# Patient Record
Sex: Female | Born: 1955 | Hispanic: Yes | Marital: Married
Health system: Southern US, Community
[De-identification: ages and names within clinical notes are randomized; demographics above are authoritative.]

## PROBLEM LIST (undated history)

## (undated) DIAGNOSIS — R42 Dizziness and giddiness: Secondary | ICD-10-CM

---

## 2017-05-09 ENCOUNTER — Emergency Department (HOSPITAL_COMMUNITY): Payer: Self-pay

## 2017-05-09 ENCOUNTER — Emergency Department (HOSPITAL_COMMUNITY)
Admission: EM | Admit: 2017-05-09 | Discharge: 2017-05-09 | Disposition: A | Discharge status: Home / Self Care | Payer: Self-pay | Attending: Emergency Medicine | Admitting: Emergency Medicine

## 2017-05-09 ENCOUNTER — Encounter (HOSPITAL_COMMUNITY): Payer: Self-pay | Admitting: Emergency Medicine

## 2017-05-09 DIAGNOSIS — W0110XA Fall on same level from slipping, tripping and stumbling with subsequent striking against unspecified object, initial encounter: Secondary | ICD-10-CM | POA: Insufficient documentation

## 2017-05-09 DIAGNOSIS — Y9389 Activity, other specified: Secondary | ICD-10-CM | POA: Insufficient documentation

## 2017-05-09 DIAGNOSIS — S065XAA Traumatic subdural hemorrhage with loss of consciousness status unknown, initial encounter: Secondary | ICD-10-CM

## 2017-05-09 DIAGNOSIS — Y929 Unspecified place or not applicable: Secondary | ICD-10-CM | POA: Insufficient documentation

## 2017-05-09 DIAGNOSIS — Y998 Other external cause status: Secondary | ICD-10-CM | POA: Insufficient documentation

## 2017-05-09 DIAGNOSIS — S065X9A Traumatic subdural hemorrhage with loss of consciousness of unspecified duration, initial encounter: Secondary | ICD-10-CM

## 2017-05-09 DIAGNOSIS — S065X0A Traumatic subdural hemorrhage without loss of consciousness, initial encounter: Secondary | ICD-10-CM | POA: Insufficient documentation

## 2017-05-09 HISTORY — DX: Dizziness and giddiness: R42

## 2017-05-09 LAB — CBC WITH DIFFERENTIAL/PLATELET
BASOS PCT: 0 %
Basophils Absolute: 0 10*3/uL (ref 0.0–0.1)
EOS PCT: 2 %
Eosinophils Absolute: 0.1 10*3/uL (ref 0.0–0.7)
HEMATOCRIT: 36.9 % (ref 36.0–46.0)
Hemoglobin: 12.1 g/dL (ref 12.0–15.0)
LYMPHS PCT: 25 %
Lymphs Abs: 1.5 10*3/uL (ref 0.7–4.0)
MCH: 30.6 pg (ref 26.0–34.0)
MCHC: 32.8 g/dL (ref 30.0–36.0)
MCV: 93.2 fL (ref 78.0–100.0)
MONO ABS: 0.4 10*3/uL (ref 0.1–1.0)
MONOS PCT: 7 %
NEUTROS ABS: 3.8 10*3/uL (ref 1.7–7.7)
Neutrophils Relative %: 66 %
PLATELETS: 214 10*3/uL (ref 150–400)
RBC: 3.96 MIL/uL (ref 3.87–5.11)
RDW: 12.6 % (ref 11.5–15.5)
WBC: 5.7 10*3/uL (ref 4.0–10.5)

## 2017-05-09 LAB — COMPREHENSIVE METABOLIC PANEL
ALBUMIN: 3.6 g/dL (ref 3.5–5.0)
ALK PHOS: 75 U/L (ref 38–126)
ALT: 18 U/L (ref 14–54)
ANION GAP: 9 (ref 5–15)
AST: 21 U/L (ref 15–41)
BILIRUBIN TOTAL: 0.4 mg/dL (ref 0.3–1.2)
BUN: 9 mg/dL (ref 6–20)
CALCIUM: 8.8 mg/dL — AB (ref 8.9–10.3)
CO2: 24 mmol/L (ref 22–32)
Chloride: 110 mmol/L (ref 101–111)
Creatinine, Ser: 0.39 mg/dL — ABNORMAL LOW (ref 0.44–1.00)
GFR calc Af Amer: >60 mL/min (ref >60)
GFR calc non Af Amer: >60 mL/min (ref >60)
GLUCOSE: 117 mg/dL — AB (ref 65–99)
Potassium: 4 mmol/L (ref 3.5–5.1)
SODIUM: 143 mmol/L (ref 135–145)
TOTAL PROTEIN: 6.5 g/dL (ref 6.5–8.1)

## 2017-05-09 LAB — PROTIME-INR
INR: 1.12
Prothrombin Time: 14.3 seconds (ref 11.4–15.2)

## 2017-05-09 LAB — TYPE AND SCREEN
ABO/RH(D): A POS
Antibody Screen: NEGATIVE

## 2017-05-09 LAB — ABO/RH: ABO/RH(D): A POS

## 2017-05-09 MED ORDER — METHYLPREDNISOLONE 4 MG PO TBPK: ORAL_TABLET | ORAL | 0 refills | Status: AC

## 2017-05-09 MED ORDER — LEVETIRACETAM 500 MG PO TABS: 500.0000 mg | ORAL_TABLET | Freq: Two times a day (BID) | ORAL | 0 refills | Status: AC

## 2017-05-09 MED ORDER — LEVETIRACETAM 500 MG PO TABS
500.0000 mg | ORAL_TABLET | Freq: Once | ORAL | Status: AC
  Administered 2017-05-09: 500 mg via ORAL
  Filled 2017-05-09: qty 1

## 2017-05-09 MED ORDER — SODIUM CHLORIDE 0.9 % IV BOLUS
1000.0000 mL | Freq: Once | INTRAVENOUS | Status: AC
  Administered 2017-05-09: 1000 mL via INTRAVENOUS

## 2017-05-09 MED ORDER — SODIUM CHLORIDE 0.9 % IV BOLUS: 1000.0000 mL | Freq: Once | INTRAVENOUS | Status: DC

## 2017-05-09 NOTE — Consult Note (Signed)
Chief Complaint   Chief Complaint  Patient presents with  . Headache    HPI   HPI: Sabrina Briggs is a 62 y.o. female who presented to ER due to headache. Patient is non-english speaking. History obtained using assistance of interpreter. Fell 15 days ago, striking head. It was an unwitnessed fall. Unsure about LOC. Complains of diffuse HA since the fall. Moderate in severity. Has been taking Tylenol and an unknown medication from [place] without relief. Yesterday she felt dizzy and unsteady on feet. Today, no dizziness or gait instability. No focal deficits. Endorses photophobia but denies blurry vision, double vision. Denies nausea, vomiting. Not on anti-coag.  There are no active problems to display for this patient.  PMH: Past Medical History:  Diagnosis Date  . Vertigo     PSH:  (Not in a hospital admission)  SH: Social History   Tobacco Use  . Smoking status: Not on file  Substance Use Topics  . Alcohol use: Not on file  . Drug use: Not on file    MEDS: Prior to Admission medications   Not on File    ALLERGY: No Known Allergies  Social History   Tobacco Use  . Smoking status: Not on file  Substance Use Topics  . Alcohol use: Not on file     No family history on file.   ROS   Review of Systems  Constitutional: Positive for malaise/fatigue. Negative for chills and fever.  HENT: Negative.   Respiratory: Negative.   Cardiovascular: Negative.   Gastrointestinal: Negative.   Genitourinary: Negative.   Musculoskeletal: Positive for falls and neck pain (chronic, states undiagnosed "disease in throat"). Negative for back pain.  Neurological: Positive for dizziness (resolved), weakness (generalized, no focal deficits) and headaches. Negative for tingling, tremors, sensory change, speech change, focal weakness and seizures.  Endo/Heme/Allergies: Negative.     Exam   Vitals:   05/09/17 0927 05/09/17 1109  BP: (!) 143/65 136/76  Pulse: 81 73  Resp: 14  18  Temp: 97.9 F (36.6 C)   SpO2: 100% 99%   General appearance: Elderly female, NAD Eyes: PERRL, Fundoscopic: normal Cardiovascular: Regular rate and rhythm without murmurs, rubs, gallops. No edema or variciosities. Distal pulses normal. Pulmonary: Clear to auscultation Musculoskeletal:     Muscle tone upper extremities: Normal    Muscle tone lower extremities: Normal    Motor exam: Upper Extremities Deltoid Bicep Tricep Grip  Right 5/5 5/5 5/5 5/5  Left 5/5 5/5 5/5 5/5   Lower Extremity IP Quad PF DF EHL  Right 5/5 5/5 5/5 5/5 5/5  Left 5/5 5/5 5/5 5/5 5/5   Neurological Awake, alert, oriented Memory and concentration grossly intact Speech fluent, appropriate CNII: Visual fields normal CNIII/IV/VI: EOMI CNV: Facial sensation normal CNVII: Symmetric, normal strength CNVIII: Grossly normal CNIX: Normal palate movement CNXI: Trap and SCM strength normal CN XII: Tongue protrusion normal Sensation grossly intact to LT DTR: Normal Coordination (finger/nose & heel/shin): Normal  Results - Imaging/Labs   Results for orders placed or performed during the hospital encounter of 05/09/17 (from the past 48 hour(s))  Comprehensive metabolic panel     Status: Abnormal   Collection Time: 05/09/17 10:28 AM  Result Value Ref Range   Sodium 143 135 - 145 mmol/L   Potassium 4.0 3.5 - 5.1 mmol/L   Chloride 110 101 - 111 mmol/L   CO2 24 22 - 32 mmol/L   Glucose, Bld 117 (H) 65 - 99 mg/dL   BUN 9 6 -  20 mg/dL   Creatinine, Ser 0.39 (L) 0.44 - 1.00 mg/dL   Calcium 8.8 (L) 8.9 - 10.3 mg/dL   Total Protein 6.5 6.5 - 8.1 g/dL   Albumin 3.6 3.5 - 5.0 g/dL   AST 21 15 - 41 U/L   ALT 18 14 - 54 U/L   Alkaline Phosphatase 75 38 - 126 U/L   Total Bilirubin 0.4 0.3 - 1.2 mg/dL   GFR calc non Af Amer >60 >60 mL/min   GFR calc Af Amer >60 >60 mL/min    Comment: (NOTE) The eGFR has been calculated using the CKD EPI equation. This calculation has not been validated in all clinical  situations. eGFR's persistently <60 mL/min signify possible Chronic Kidney Disease.    Anion gap 9 5 - 15    Comment: Performed at [facility] [address] [place], [place] [address]  CBC with Differential     Status: None   Collection Time: 05/09/17 10:28 AM  Result Value Ref Range   WBC 5.7 4.0 - 10.5 K/uL   RBC 3.96 3.87 - 5.11 MIL/uL   Hemoglobin 12.1 12.0 - 15.0 g/dL   HCT 36.9 36.0 - 46.0 %   MCV 93.2 78.0 - 100.0 fL   MCH 30.6 26.0 - 34.0 pg   MCHC 32.8 30.0 - 36.0 g/dL   RDW 12.6 11.5 - 15.5 %   Platelets 214 150 - 400 K/uL   Neutrophils Relative % 66 %   Neutro Abs 3.8 1.7 - 7.7 K/uL   Lymphocytes Relative 25 %   Lymphs Abs 1.5 0.7 - 4.0 K/uL   Monocytes Relative 7 %   Monocytes Absolute 0.4 0.1 - 1.0 K/uL   Eosinophils Relative 2 %   Eosinophils Absolute 0.1 0.0 - 0.7 K/uL   Basophils Relative 0 %   Basophils Absolute 0.0 0.0 - 0.1 K/uL    Comment: Performed at [facility] [address], [place], [place] [address]  Protime-INR     Status: None   Collection Time: 05/09/17 11:13 AM  Result Value Ref Range   Prothrombin Time 14.3 11.4 - 15.2 seconds   INR 1.12     Comment: Performed at [facility] [address] [place], [place] [address]  Type and screen     Status: None (Preliminary result)   Collection Time: 05/09/17 11:27 AM  Result Value Ref Range   ABO/RH(D) A POS    Antibody Screen PENDING    Sample Expiration      05/12/2017 Performed at [facility], [place] [address] [place], [place] [address]     Ct Head Wo Contrast  Result Date: 05/09/2017 CLINICAL DATA:  Status post fall 15 days ago. Generalized headache for 1 week. EXAM: CT HEAD WITHOUT CONTRAST TECHNIQUE: Contiguous axial images were obtained from the base of the skull through the vertex without intravenous contrast. COMPARISON:  None. FINDINGS: Brain: The patient has bilateral subdural hematomas with both low attenuating and intermediate to high attenuating  components consistent with acute on chronic change. Hematoma on the left measures up to 1.1 cm over the frontal convexities. On the right, hematoma measures up to 0.7 cm. There is also a small amount of subdural blood along the right tentorium. Tiny amount of intraventricular hemorrhage is also identified. There is no midline shift or hydrocephalus. No mass lesion is seen. Vascular: No hyperdense vessel or unexpected calcification. Skull: Intact.  No focal lesion. Sinuses/Orbits: Negative. Other: None. IMPRESSION: Acute  bilateral acute to subacute subdural hematomas. Collection on the left is larger. There is no midline shift. Small amount of intraventricular blood is also identified. Critical Value/emergent results were called by telephone at the time of interpretation on 05/09/2017 at 11:17 am to [facility] who verbally acknowledged these results. Electronically Signed   By: Inge Rise M.D.   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 There is no mass effect, MLS or hydrocephalus. Although she endorses generalized weakness, she is neurologically intact & without focal deficits. There is no indication for acute NS intervention. These small collections should hopefully resolve with time. Because injury >24 hours and she is neuro intact, okay for discharge with strong return precautions.  - Will start on medrol dose pack to hopefully help with reabsorption.  - Keppra 526m BID x7 days for seizure prophylaxis. - Strong return precautions discussed at length - F/U outpt in 2 weeks for serial monitoring of SDH

## 2017-05-09 NOTE — ED Provider Notes (Signed)
[facility] EMERGENCY DEPARTMENT Provider Note   CSN: [id number] Arrival date & time: 05/09/17  4098     History   Chief Complaint Chief Complaint  Patient presents with  . Headache    HPI Sabrina Briggs is a 62 y.o. female hx of vertigo, here with headache, dizziness. Patient had a fall 15 days ago and did hit her head. She was doing well until about 8 days and and had worsening headaches since then. She came to the [place] several days ago. Has been taking tylenol with minimal relief. Yesterday, she had worsening headaches and felt dizzy. Denies trouble speaking or weakness. Not on blood thinners.   The history is provided by the patient.    Past Medical History:  Diagnosis Date  . Vertigo     There are no active problems to display for this patient.    OB History   None      Home Medications    Prior to Admission medications   Not on File    Family History No family history on file.  Social History Social History   Tobacco Use  . Smoking status: Not on file  Substance Use Topics  . Alcohol use: Not on file  . Drug use: Not on file     Allergies   Patient has no known allergies.   Review of Systems Review of Systems  Neurological: Positive for headaches.  All other systems reviewed and are negative.    Physical Exam Updated Vital Signs BP 130/77   Pulse 65   Temp 97.9 F (36.6 C) (Oral)   Resp 16   SpO2 100%   Physical Exam  Constitutional: She is oriented to person, place, and time. She appears well-developed.  HENT:  Head: Normocephalic.  Mouth/Throat: Oropharynx is clear and moist.  Eyes: Pupils are equal, round, and reactive to light. EOM are normal.  Neck: Normal range of motion.  Cardiovascular: Normal rate.  Pulmonary/Chest: Effort normal and breath sounds normal.  Abdominal: Soft. Bowel sounds are normal.  Musculoskeletal: Normal range of motion.  Neurological: She is alert and oriented to person, place,  and time. She has normal strength. She displays a negative Romberg sign.  Skin: Skin is warm. Capillary refill takes less than 2 seconds.  Psychiatric: She has a normal mood and affect. Her behavior is normal.  Nursing note and vitals reviewed.    ED Treatments / Results  Labs (all labs ordered are listed, but only abnormal results are displayed) Labs Reviewed  COMPREHENSIVE METABOLIC PANEL - Abnormal; Notable for the following components:      Result Value   Glucose, Bld 117 (*)    Creatinine, Ser 0.39 (*)    Calcium 8.8 (*)    All other components within normal limits  CBC WITH DIFFERENTIAL/PLATELET  PROTIME-INR  TYPE AND SCREEN  ABO/RH    EKG None  Radiology Ct Head Wo Contrast  Result Date: 05/09/2017 CLINICAL DATA:  Status post fall 15 days ago. Generalized headache for 1 week. EXAM: CT HEAD WITHOUT CONTRAST TECHNIQUE: Contiguous axial images were obtained from the base of the skull through the vertex without intravenous contrast. COMPARISON:  None. FINDINGS: Brain: The patient has bilateral subdural hematomas with both low attenuating and intermediate to high attenuating components consistent with acute on chronic change. Hematoma on the left measures up to 1.1 cm over the frontal convexities. On the right, hematoma measures up to 0.7 cm. There is also a small amount of subdural blood  along the right tentorium. Tiny amount of intraventricular hemorrhage is also identified. There is no midline shift or hydrocephalus. No mass lesion is seen. Vascular: No hyperdense vessel or unexpected calcification. Skull: Intact.  No focal lesion. Sinuses/Orbits: Negative. Other: None. IMPRESSION: Acute bilateral acute to subacute subdural hematomas. Collection on the left is larger. There is no midline shift. Small amount of intraventricular blood is also identified. Critical Value/emergent results were called by telephone at the time of interpretation on 05/09/2017 at 11:17 am to [facility] who verbally acknowledged these results. Electronically Signed   By: Drusilla Kanner M.D.   On: 05/09/2017 11:21    Procedures Procedures (including critical care time)    Medications Ordered in ED Medications  levETIRAcetam (KEPPRA) tablet 500 mg (has no administration in time range)  sodium chloride 0.9 % bolus 1,000 mL (0 mLs Intravenous Stopped 05/09/17 1241)     Initial Impression / Assessment and Plan / ED Course  I have reviewed the triage vital signs and the nursing notes.  Pertinent labs & imaging results that were available during my care of the patient were reviewed by me and considered in my medical decision making (see chart for details).     Sabrina Briggs is a 62 y.o. female here with headache, dizziness. Patient had head injury 15 days ago so consider subdural hemorrhage vs migraines. Will get labs, CT head.   1:11 PM Patient's CT showed bilateral subdurals. Neurologically intact. Neurosurgery saw patient and thought that she had subacute subdural from recent head injury. Recommend keppra 500 mg BID, medrol dose pack, outpatient follow up with neurosurgery.   Final Clinical Impressions(s) / ED Diagnoses   Final diagnoses:  None    ED Discharge Orders    None       Charlynne Pander, MD 05/09/17 1317

## 2017-05-09 NOTE — ED Triage Notes (Addendum)
Interpretter used: Pt complains of bad headache for week. She states she took aleve PM and takes Astronomer (betahistina) which is for vertigo. She denies hx of strokes or heart attacks. In [place] she was told she has vertigo. She states she was told that she has "clogged neck veins." Speech is clear. She also is complaining of throat pain. 2 years ago had scan of head and everything clean. She states problems with her head started " 3 mos ago when husband died". She denies chest pain or SOB. Neuro intact

## 2017-05-09 NOTE — Discharge Instructions (Addendum)
Take keppra 500 mg twice daily for a week.   Take medrol dose pack.   Take tylenol for headaches.   See Dr. Conchita Paris in a week for follow up   Return to ER if you have worse headaches, dizziness, passing out, weakness, numbness

## 2017-05-09 NOTE — ED Notes (Signed)
Family at bedside. 

## 2017-05-17 ENCOUNTER — Other Ambulatory Visit (HOSPITAL_COMMUNITY): Payer: Self-pay | Admitting: Neurosurgery

## 2017-05-17 DIAGNOSIS — I6200 Nontraumatic subdural hemorrhage, unspecified: Secondary | ICD-10-CM

## 2017-05-26 ENCOUNTER — Ambulatory Visit (HOSPITAL_COMMUNITY)
Admission: RE | Admit: 2017-05-26 | Discharge: 2017-05-26 | Disposition: A | Discharge status: Home / Self Care | Payer: Self-pay | Source: Ambulatory Visit | Attending: Neurosurgery | Admitting: Neurosurgery

## 2017-05-26 ENCOUNTER — Ambulatory Visit (HOSPITAL_COMMUNITY): Payer: Self-pay

## 2017-05-26 DIAGNOSIS — I6200 Nontraumatic subdural hemorrhage, unspecified: Secondary | ICD-10-CM | POA: Insufficient documentation

## 2018-12-18 IMAGING — CT CT HEAD W/O CM
4 series · 15 of 47 positions shown, 17 images · non-contrast
Comparison: None.

CLINICAL DATA: Status post fall 15 days ago. Generalized headache
for 1 week.

EXAM:
CT HEAD WITHOUT CONTRAST
TECHNIQUE: Contiguous axial images were obtained from the base of the skull
through the vertex without intravenous contrast.

[Series 3: head without · axial · non-contrast · 0.41mm/px · z∈[-52,+58]mm · 7 of 30 slices shown, 9 images]
[im 4/30  brain]
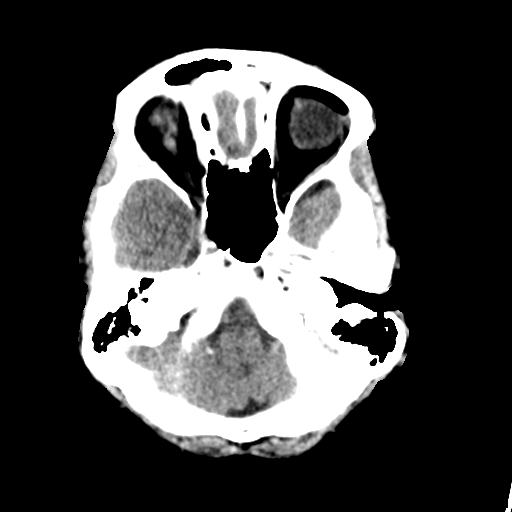
[im 4/30  bone]
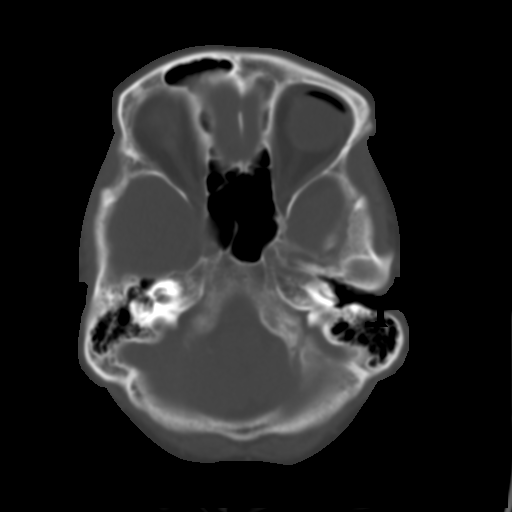
[im 8/30  brain]
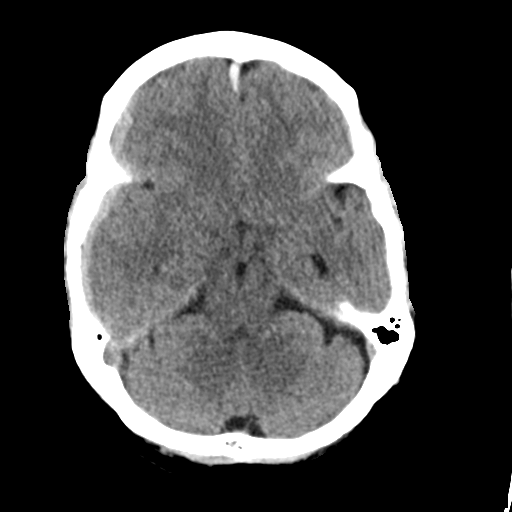
[im 11/30  brain]
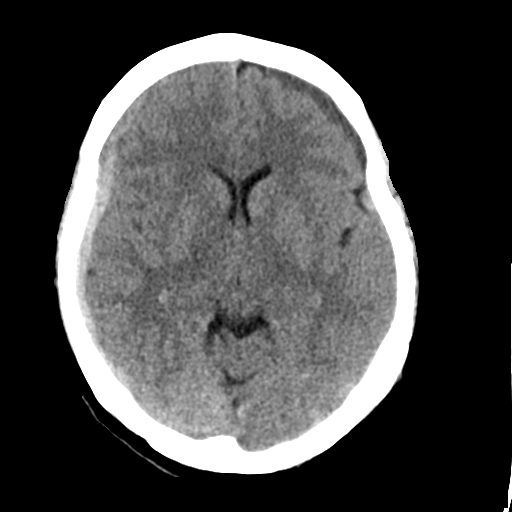
[im 15/30  brain]
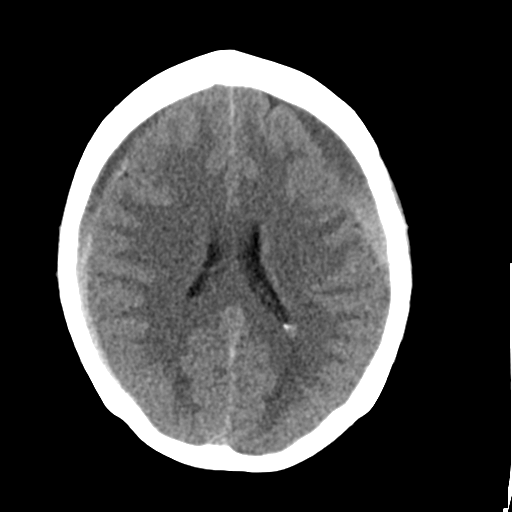
[im 19/30  brain]
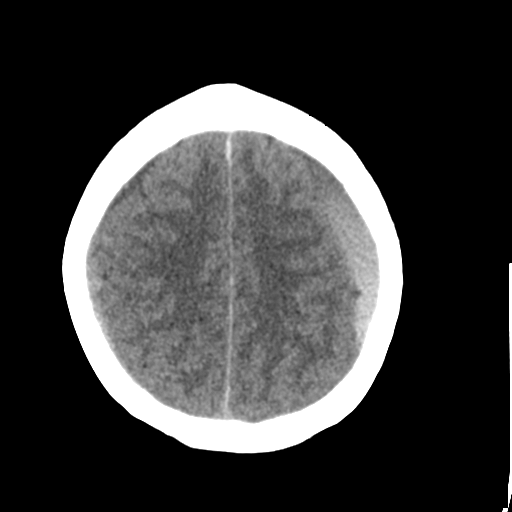
[im 19/30  bone]
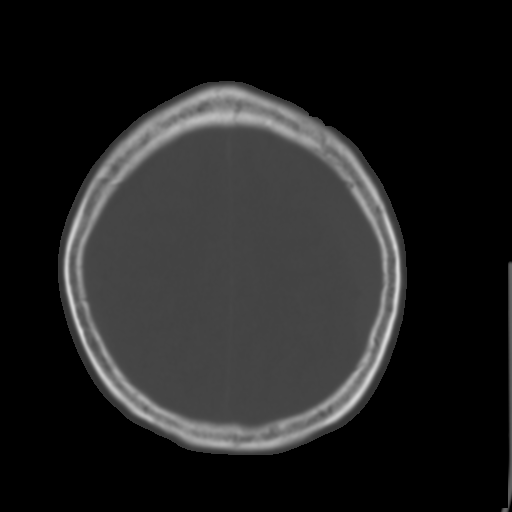
[im 22/30  brain]
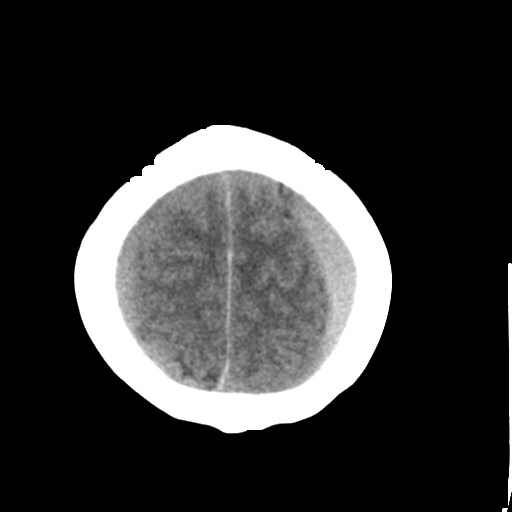
[im 26/30  brain]
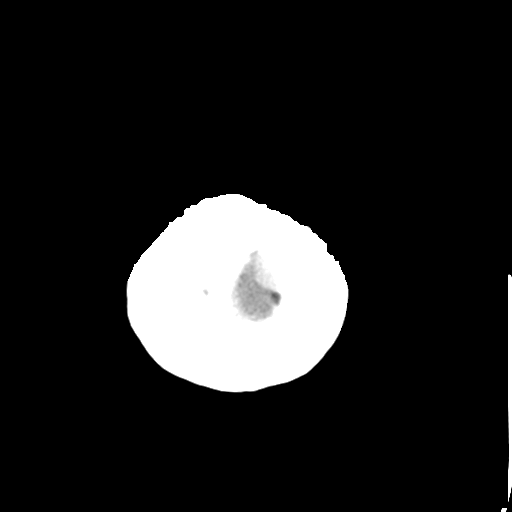

[Series 4: head bone · axial · 0.41mm/px · z∈[-53,-39]mm · 2 of 74 slices shown]
[im 8/74  bone]
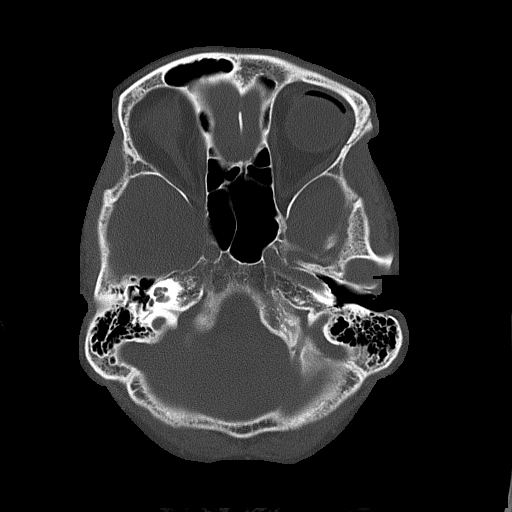
[im 15/74  bone]
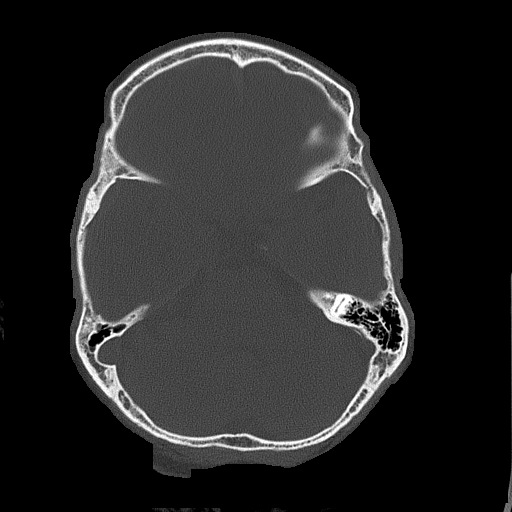

[Series 5: head without cor · coronal · non-contrast · 0.29mm/px · 3 of 65 slices shown]
[im 22/65  brain]
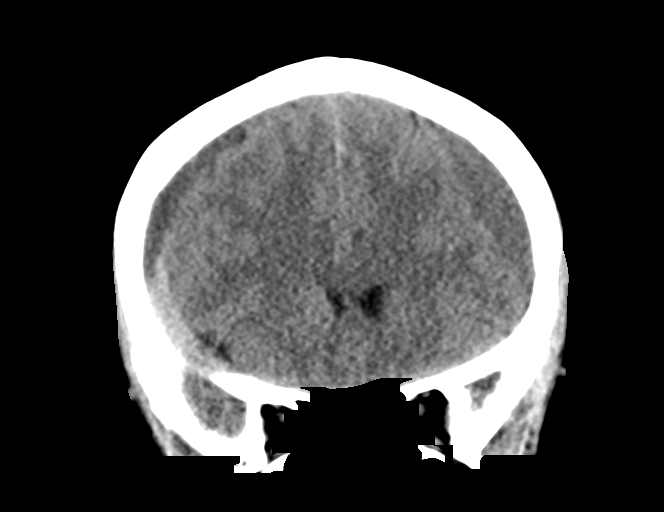
[im 29/65  brain]
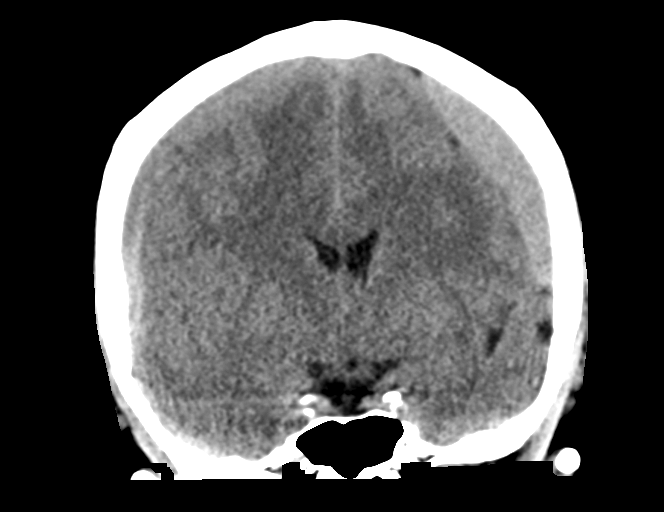
[im 36/65  brain]
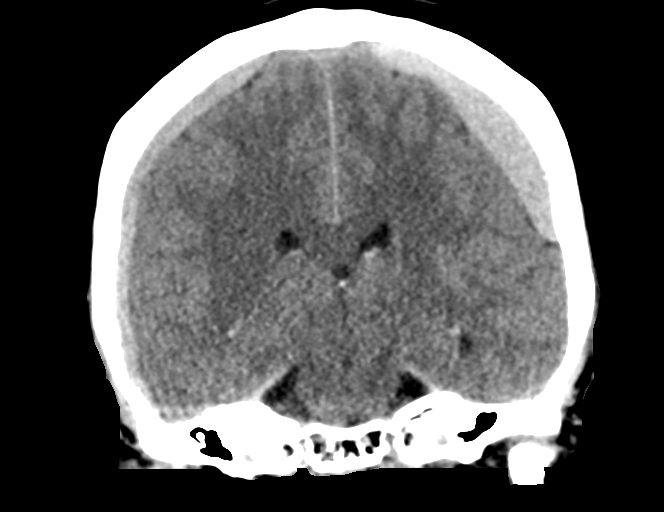

[Series 6: head without sag · sagittal · non-contrast · 0.32mm/px · 3 of 67 slices shown]
[im 23/67  brain]
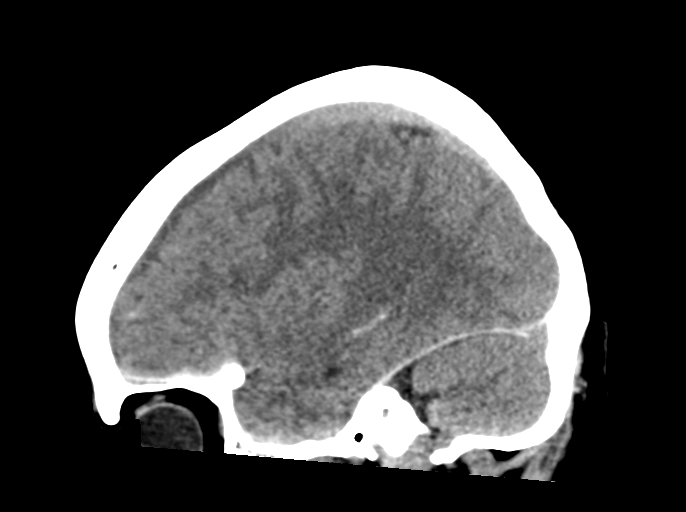
[im 34/67  brain]
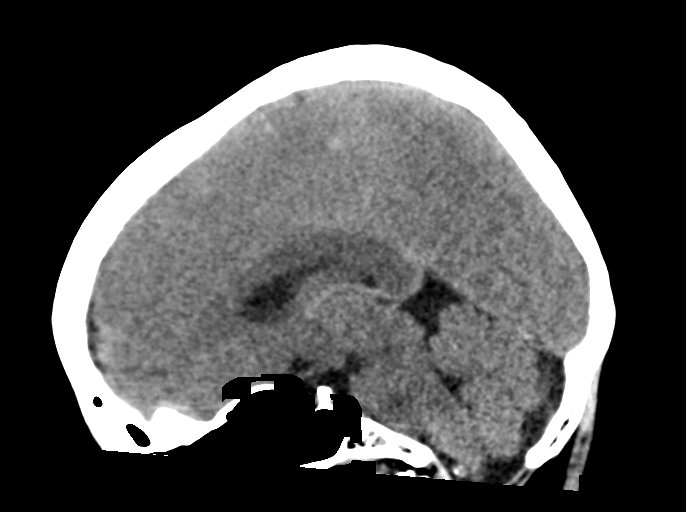
[im 45/67  brain]
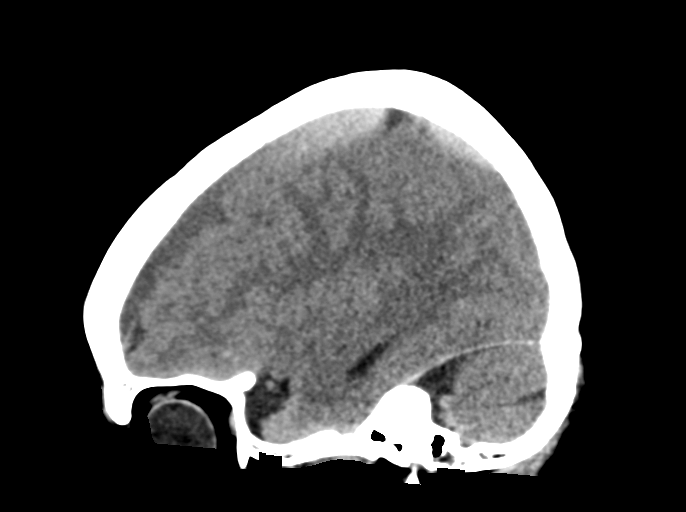

[15 of 47 positions shown; findings below may reference images not displayed]

FINDINGS: Brain: The patient has bilateral subdural hematomas with both low
attenuating and intermediate to high attenuating components
consistent with acute on chronic change. Hematoma on the left
measures up to 1.1 cm over the frontal convexities. On the right,
hematoma measures up to 0.7 cm. There is also a small amount of
subdural blood along the right tentorium. Tiny amount of
intraventricular hemorrhage is also identified. There is no midline
shift or hydrocephalus. No mass lesion is seen.

Vascular: No hyperdense vessel or unexpected calcification.

Skull: Intact.  No focal lesion.

Sinuses/Orbits: Negative.

Other: None.
IMPRESSION: Acute bilateral acute to subacute subdural hematomas. Collection on
the left is larger. There is no midline shift. Small amount of
intraventricular blood is also identified.

Critical Value/emergent results were called by telephone at the time
of interpretation on 05/09/2017 at [DATE] to TIGER, OURARI.,
who verbally acknowledged these results.
